# Patient Record
Sex: Female | Born: 1950 | Race: Black or African American | Hispanic: No | Marital: Married | Smoking: Never smoker
Health system: Southern US, Community
[De-identification: ages and names within clinical notes are randomized; demographics above are authoritative.]

## PROBLEM LIST (undated history)

## (undated) DIAGNOSIS — E7800 Pure hypercholesterolemia, unspecified: Secondary | ICD-10-CM

## (undated) DIAGNOSIS — I10 Essential (primary) hypertension: Secondary | ICD-10-CM

---

## 1998-08-04 ENCOUNTER — Encounter: Payer: Self-pay | Admitting: Emergency Medicine

## 1998-08-04 ENCOUNTER — Emergency Department (HOSPITAL_COMMUNITY): Admission: EM | Admit: 1998-08-04 | Discharge: 1998-08-04 | Payer: Self-pay | Admitting: Emergency Medicine

## 1999-02-10 ENCOUNTER — Encounter: Admission: RE | Admit: 1999-02-10 | Discharge: 1999-02-10 | Payer: Self-pay | Admitting: Family Medicine

## 1999-02-10 ENCOUNTER — Encounter: Payer: Self-pay | Admitting: Family Medicine

## 2000-11-30 ENCOUNTER — Emergency Department (HOSPITAL_COMMUNITY): Admission: EM | Admit: 2000-11-30 | Discharge: 2000-11-30 | Payer: Self-pay | Admitting: Emergency Medicine

## 2000-11-30 ENCOUNTER — Encounter: Payer: Self-pay | Admitting: Emergency Medicine

## 2001-04-18 ENCOUNTER — Encounter: Admission: RE | Admit: 2001-04-18 | Discharge: 2001-04-18 | Payer: Self-pay | Admitting: Family Medicine

## 2001-04-18 ENCOUNTER — Encounter: Payer: Self-pay | Admitting: Family Medicine

## 2008-10-31 ENCOUNTER — Emergency Department (HOSPITAL_COMMUNITY): Admission: EM | Admit: 2008-10-31 | Discharge: 2008-10-31 | Payer: Self-pay | Admitting: Emergency Medicine

## 2009-10-13 ENCOUNTER — Emergency Department (HOSPITAL_COMMUNITY): Admission: EM | Admit: 2009-10-13 | Discharge: 2009-10-13 | Payer: Self-pay | Admitting: Emergency Medicine

## 2010-04-03 LAB — BASIC METABOLIC PANEL
BUN: 8 mg/dL (ref 6–23)
CO2: 31 mEq/L (ref 19–32)
Calcium: 9.8 mg/dL (ref 8.4–10.5)
Chloride: 103 mEq/L (ref 96–112)
Creatinine, Ser: 0.87 mg/dL (ref 0.4–1.2)
GFR calc Af Amer: >60 mL/min (ref >60)
GFR calc non Af Amer: >60 mL/min (ref >60)
Glucose, Bld: 97 mg/dL (ref 70–99)
Potassium: 3.5 mEq/L (ref 3.5–5.1)
Sodium: 140 mEq/L (ref 135–145)

## 2010-04-03 LAB — DIFFERENTIAL
Basophils Absolute: 0 10*3/uL (ref 0.0–0.1)
Basophils Relative: 1 % (ref 0–1)
Eosinophils Absolute: 0.3 10*3/uL (ref 0.0–0.7)
Eosinophils Relative: 3 % (ref 0–5)
Lymphocytes Relative: 49 % — ABNORMAL HIGH (ref 12–46)
Lymphs Abs: 3.9 10*3/uL (ref 0.7–4.0)
Monocytes Absolute: 0.4 10*3/uL (ref 0.1–1.0)
Monocytes Relative: 5 % (ref 3–12)
Neutro Abs: 3.4 10*3/uL (ref 1.7–7.7)
Neutrophils Relative %: 42 % — ABNORMAL LOW (ref 43–77)

## 2010-04-03 LAB — URINALYSIS, ROUTINE W REFLEX MICROSCOPIC
Glucose, UA: NEGATIVE mg/dL
Hgb urine dipstick: NEGATIVE
Ketones, ur: NEGATIVE mg/dL
Protein, ur: NEGATIVE mg/dL

## 2010-04-03 LAB — CBC
HCT: 37.7 % (ref 36.0–46.0)
Hemoglobin: 12.5 g/dL (ref 12.0–15.0)
MCH: 27.7 pg (ref 26.0–34.0)
MCHC: 33.2 g/dL (ref 30.0–36.0)
MCV: 83.6 fL (ref 78.0–100.0)
Platelets: 272 10*3/uL (ref 150–400)
RBC: 4.51 MIL/uL (ref 3.87–5.11)
RDW: 14.5 % (ref 11.5–15.5)
WBC: 8 10*3/uL (ref 4.0–10.5)

## 2010-04-03 LAB — GLUCOSE, CAPILLARY: Glucose-Capillary: 96 mg/dL (ref 70–99)

## 2014-01-19 ENCOUNTER — Emergency Department (HOSPITAL_COMMUNITY)
Admission: EM | Admit: 2014-01-19 | Discharge: 2014-01-19 | Disposition: A | Discharge status: Home / Self Care | Payer: BC Managed Care – PPO | Attending: Emergency Medicine | Admitting: Emergency Medicine

## 2014-01-19 ENCOUNTER — Encounter (HOSPITAL_COMMUNITY): Payer: Self-pay | Admitting: Emergency Medicine

## 2014-01-19 DIAGNOSIS — N814 Uterovaginal prolapse, unspecified: Secondary | ICD-10-CM

## 2014-01-19 DIAGNOSIS — A5901 Trichomonal vulvovaginitis: Secondary | ICD-10-CM | POA: Diagnosis not present

## 2014-01-19 DIAGNOSIS — I10 Essential (primary) hypertension: Secondary | ICD-10-CM | POA: Diagnosis not present

## 2014-01-19 DIAGNOSIS — N898 Other specified noninflammatory disorders of vagina: Secondary | ICD-10-CM | POA: Diagnosis present

## 2014-01-19 DIAGNOSIS — Z79899 Other long term (current) drug therapy: Secondary | ICD-10-CM | POA: Insufficient documentation

## 2014-01-19 DIAGNOSIS — E782 Mixed hyperlipidemia: Secondary | ICD-10-CM | POA: Diagnosis not present

## 2014-01-19 HISTORY — DX: Pure hypercholesterolemia, unspecified: E78.00

## 2014-01-19 HISTORY — DX: Essential (primary) hypertension: I10

## 2014-01-19 LAB — URINALYSIS, ROUTINE W REFLEX MICROSCOPIC
BILIRUBIN URINE: NEGATIVE
Bilirubin Urine: NEGATIVE
GLUCOSE, UA: NEGATIVE mg/dL
Glucose, UA: NEGATIVE mg/dL
KETONES UR: NEGATIVE mg/dL
KETONES UR: NEGATIVE mg/dL
NITRITE: NEGATIVE
Nitrite: NEGATIVE
PROTEIN: NEGATIVE mg/dL
PROTEIN: NEGATIVE mg/dL
SPECIFIC GRAVITY, URINE: 1.01 (ref 1.005–1.030)
Specific Gravity, Urine: 1.004 — ABNORMAL LOW (ref 1.005–1.030)
UROBILINOGEN UA: 0.2 mg/dL (ref 0.0–1.0)
UROBILINOGEN UA: 0.2 mg/dL (ref 0.0–1.0)
pH: 7.5 (ref 5.0–8.0)
pH: 7.5 (ref 5.0–8.0)

## 2014-01-19 LAB — CBC WITH DIFFERENTIAL/PLATELET
BASOS ABS: 0.1 10*3/uL (ref 0.0–0.1)
Basophils Relative: 1 % (ref 0–1)
EOS ABS: 0.3 10*3/uL (ref 0.0–0.7)
EOS PCT: 3 % (ref 0–5)
HCT: 39.3 % (ref 36.0–46.0)
Hemoglobin: 13.1 g/dL (ref 12.0–15.0)
LYMPHS ABS: 4.4 10*3/uL — AB (ref 0.7–4.0)
Lymphocytes Relative: 39 % (ref 12–46)
MCH: 27.9 pg (ref 26.0–34.0)
MCHC: 33.3 g/dL (ref 30.0–36.0)
MCV: 83.6 fL (ref 78.0–100.0)
MONO ABS: 0.6 10*3/uL (ref 0.1–1.0)
Monocytes Relative: 5 % (ref 3–12)
NEUTROS PCT: 52 % (ref 43–77)
Neutro Abs: 6 10*3/uL (ref 1.7–7.7)
PLATELETS: 307 10*3/uL (ref 150–400)
RBC: 4.7 MIL/uL (ref 3.87–5.11)
RDW: 14.2 % (ref 11.5–15.5)
WBC: 11.4 10*3/uL — ABNORMAL HIGH (ref 4.0–10.5)

## 2014-01-19 LAB — URINE MICROSCOPIC-ADD ON

## 2014-01-19 LAB — WET PREP, GENITAL: Yeast Wet Prep HPF POC: NONE SEEN

## 2014-01-19 LAB — BASIC METABOLIC PANEL
ANION GAP: 11 (ref 5–15)
BUN: 10 mg/dL (ref 6–23)
CALCIUM: 9.9 mg/dL (ref 8.4–10.5)
CHLORIDE: 102 meq/L (ref 96–112)
CO2: 25 mmol/L (ref 19–32)
CREATININE: 0.83 mg/dL (ref 0.50–1.10)
GFR calc non Af Amer: 75 mL/min — ABNORMAL LOW (ref >90)
GFR, EST AFRICAN AMERICAN: 86 mL/min — AB (ref >90)
Glucose, Bld: 94 mg/dL (ref 70–99)
Potassium: 2.9 mmol/L — ABNORMAL LOW (ref 3.5–5.1)
Sodium: 138 mmol/L (ref 135–145)

## 2014-01-19 MED ORDER — CEPHALEXIN 500 MG PO CAPS: 500.0000 mg | ORAL_CAPSULE | Freq: Four times a day (QID) | ORAL | Status: AC

## 2014-01-19 MED ORDER — METRONIDAZOLE 500 MG PO TABS: 500.0000 mg | ORAL_TABLET | Freq: Two times a day (BID) | ORAL | Status: AC

## 2014-01-19 NOTE — ED Notes (Signed)
MD at bedside. 

## 2014-01-19 NOTE — ED Notes (Signed)
Pt A&OX4, ambulatory at d/c with steady gait, NAD 

## 2014-01-19 NOTE — ED Notes (Signed)
Pt c/o vaginal burning and itching x 1 week and sts some frequency with urination

## 2014-01-19 NOTE — ED Provider Notes (Signed)
CSN: [id number]     Arrival date & time 01/19/14  1817 History   First MD Initiated Contact with Patient 01/19/14 1916     Chief Complaint  Patient presents with  . Urinary Tract Infection  . Vaginal Discharge     (Consider location/radiation/quality/duration/timing/severity/associated sxs/prior Treatment) Patient is a 64 y.o. female presenting with vaginal discharge. The history is provided by the patient.  Vaginal Discharge Quality:  Yellow Severity:  Moderate Onset quality:  Gradual Duration:  1 week Timing:  Constant Progression:  Unchanged Chronicity:  New Relieved by:  Nothing Worsened by:  Nothing tried Ineffective treatments:  None tried Associated symptoms: dysuria   Associated symptoms: no abdominal pain, no fever, no nausea and no vomiting     Past Medical History  Diagnosis Date  . Hypertension   . Hypercholesteremia    History reviewed. No pertinent past surgical history. History reviewed. No pertinent family history. History  Substance Use Topics  . Smoking status: Never Smoker   . Smokeless tobacco: Not on file  . Alcohol Use: No   OB History    No data available     Review of Systems  Constitutional: Negative for fever, chills, diaphoresis, activity change, appetite change and fatigue.  HENT: Negative for facial swelling, rhinorrhea, sore throat, trouble swallowing and voice change.   Eyes: Negative for photophobia, pain and visual disturbance.  Respiratory: Negative for cough, shortness of breath, wheezing and stridor.   Cardiovascular: Negative for chest pain, palpitations and leg swelling.  Gastrointestinal: Negative for nausea, vomiting, abdominal pain, constipation and anal bleeding.  Endocrine: Negative.   Genitourinary: Positive for dysuria and vaginal discharge. Negative for vaginal bleeding and vaginal pain.  Musculoskeletal: Negative for myalgias, back pain and arthralgias.  Skin: Negative.  Negative for rash.  Allergic/Immunologic:  Negative.   Neurological: Negative for dizziness, tremors, syncope, weakness and headaches.  Psychiatric/Behavioral: Negative for suicidal ideas, sleep disturbance and self-injury.  All other systems reviewed and are negative.     Allergies  Review of patient's allergies indicates no known allergies.  Home Medications   Prior to Admission medications   Medication Sig Start Date End Date Taking? Authorizing Provider  hydrochlorothiazide (HYDRODIURIL) 25 MG tablet Take 25 mg by mouth daily.   Yes Historical Provider, MD  simvastatin (ZOCOR) 20 MG tablet Take 20 mg by mouth daily.   Yes Historical Provider, MD  cephALEXin (KEFLEX) 500 MG capsule Take 1 capsule (500 mg total) by mouth 4 (four) times daily. 01/19/14   Lula Olszewski, MD  metroNIDAZOLE (FLAGYL) 500 MG tablet Take 1 tablet (500 mg total) by mouth 2 (two) times daily. 01/19/14   Lula Olszewski, MD   BP 139/83 mmHg  Pulse 65  Temp(Src) 98.2 F (36.8 C) (Oral)  Resp 12  SpO2 98% Physical Exam  Constitutional: She is oriented to person, place, and time. She appears well-developed and well-nourished. No distress.  HENT:  Head: Normocephalic and atraumatic.  Right Ear: External ear normal.  Left Ear: External ear normal.  Mouth/Throat: Oropharynx is clear and moist. No oropharyngeal exudate.  Eyes: Conjunctivae and EOM are normal. Pupils are equal, round, and reactive to light. No scleral icterus.  Neck: Normal range of motion. Neck supple. No JVD present. No tracheal deviation present. No thyromegaly present.  Cardiovascular: Normal rate, regular rhythm and intact distal pulses.  Exam reveals no gallop and no friction rub.   No murmur heard. Pulmonary/Chest: Effort normal and breath sounds normal. No respiratory distress. She has no wheezes. She  has no rales.  Abdominal: Soft. Bowel sounds are normal. She exhibits no distension. There is no tenderness.  Genitourinary: Pelvic exam was performed with patient supine. Cervix exhibits no  motion tenderness. Right adnexum displays no mass, no tenderness and no fullness. Left adnexum displays no mass, no tenderness and no fullness. Vaginal discharge (yellow) found.  Tissue protruding into vagina on pelvic exam consistent with uterine prolapse. Incomplete and unincarcerated.   Musculoskeletal: Normal range of motion. She exhibits no edema or tenderness.  Neurological: She is alert and oriented to person, place, and time. No cranial nerve deficit. She exhibits normal muscle tone. Coordination normal.  Skin: Skin is warm and dry. She is not diaphoretic. No pallor.  Psychiatric: She has a normal mood and affect. She expresses no homicidal and no suicidal ideation. She expresses no suicidal plans and no homicidal plans.  Nursing note and vitals reviewed.   ED Course  Procedures (including critical care time) Labs Review Labs Reviewed  WET PREP, GENITAL - Abnormal; Notable for the following:    Trich, Wet Prep FEW (*)    Clue Cells Wet Prep HPF POC FEW (*)    WBC, Wet Prep HPF POC MODERATE (*)    All other components within normal limits  URINALYSIS, ROUTINE W REFLEX MICROSCOPIC - Abnormal; Notable for the following:    APPearance CLOUDY (*)    Hgb urine dipstick SMALL (*)    Leukocytes, UA LARGE (*)    All other components within normal limits  CBC WITH DIFFERENTIAL - Abnormal; Notable for the following:    WBC 11.4 (*)    Lymphs Abs 4.4 (*)    All other components within normal limits  BASIC METABOLIC PANEL - Abnormal; Notable for the following:    Potassium 2.9 (*)    GFR calc non Af Amer 75 (*)    GFR calc Af Amer 86 (*)    All other components within normal limits  URINE MICROSCOPIC-ADD ON - Abnormal; Notable for the following:    Squamous Epithelial / LPF MANY (*)    Bacteria, UA FEW (*)    All other components within normal limits  URINALYSIS, ROUTINE W REFLEX MICROSCOPIC - Abnormal; Notable for the following:    Color, Urine STRAW (*)    APPearance HAZY (*)     Specific Gravity, Urine 1.004 (*)    Hgb urine dipstick TRACE (*)    Leukocytes, UA LARGE (*)    All other components within normal limits  URINE MICROSCOPIC-ADD ON - Abnormal; Notable for the following:    Squamous Epithelial / LPF FEW (*)    Bacteria, UA FEW (*)    All other components within normal limits  GC/CHLAMYDIA PROBE AMP  RPR  HIV ANTIBODY (ROUTINE TESTING)    Imaging Review No results found.   EKG Interpretation None      MDM   Final diagnoses:  Trichomonas vaginitis  Prolapsed uterus    The patient is a 64 y.o. F who presents with 1 week of vaginal discharge and dyuria. Pelvic exam consistent with incomplete uterine prolapse, patient made aware of findings and need to follow up with OBGYN ASAP. Labs show trichomonas and equivial urine. Treated with flagyl and keflex. Patient discharged with above mentioned follow up and standard ED return precautions.  Patient seen with attending, Dr. Rubin Payor, who oversaw clinical decision making.     Lula Olszewski, MD 01/19/14 2220  Juliet Rude. Rubin Payor, MD 01/22/14 [phone]

## 2014-01-21 LAB — RPR

## 2014-01-21 LAB — HIV ANTIBODY (ROUTINE TESTING W REFLEX): HIV: NONREACTIVE

## 2014-01-24 LAB — GC/CHLAMYDIA PROBE AMP
CT Probe RNA: NEGATIVE
GC PROBE AMP APTIMA: NEGATIVE

## 2014-08-29 ENCOUNTER — Encounter (HOSPITAL_COMMUNITY): Payer: Self-pay | Admitting: Family Medicine

## 2014-08-29 ENCOUNTER — Emergency Department (HOSPITAL_COMMUNITY): Payer: BC Managed Care – PPO

## 2014-08-29 ENCOUNTER — Emergency Department (HOSPITAL_COMMUNITY)
Admission: EM | Admit: 2014-08-29 | Discharge: 2014-08-29 | Disposition: A | Discharge status: Home / Self Care | Payer: BC Managed Care – PPO | Attending: Emergency Medicine | Admitting: Emergency Medicine

## 2014-08-29 DIAGNOSIS — W010XXA Fall on same level from slipping, tripping and stumbling without subsequent striking against object, initial encounter: Secondary | ICD-10-CM | POA: Insufficient documentation

## 2014-08-29 DIAGNOSIS — Y9389 Activity, other specified: Secondary | ICD-10-CM | POA: Diagnosis not present

## 2014-08-29 DIAGNOSIS — I10 Essential (primary) hypertension: Secondary | ICD-10-CM | POA: Insufficient documentation

## 2014-08-29 DIAGNOSIS — E780 Pure hypercholesterolemia: Secondary | ICD-10-CM | POA: Diagnosis not present

## 2014-08-29 DIAGNOSIS — Z79899 Other long term (current) drug therapy: Secondary | ICD-10-CM | POA: Diagnosis not present

## 2014-08-29 DIAGNOSIS — S50312A Abrasion of left elbow, initial encounter: Secondary | ICD-10-CM | POA: Diagnosis not present

## 2014-08-29 DIAGNOSIS — Y9289 Other specified places as the place of occurrence of the external cause: Secondary | ICD-10-CM | POA: Diagnosis not present

## 2014-08-29 DIAGNOSIS — Y998 Other external cause status: Secondary | ICD-10-CM | POA: Diagnosis not present

## 2014-08-29 DIAGNOSIS — M25512 Pain in left shoulder: Secondary | ICD-10-CM

## 2014-08-29 DIAGNOSIS — W19XXXA Unspecified fall, initial encounter: Secondary | ICD-10-CM

## 2014-08-29 DIAGNOSIS — S4992XA Unspecified injury of left shoulder and upper arm, initial encounter: Secondary | ICD-10-CM | POA: Insufficient documentation

## 2014-08-29 MED ORDER — HYDROCODONE-ACETAMINOPHEN 5-325 MG PO TABS: 1.0000 | ORAL_TABLET | Freq: Four times a day (QID) | ORAL | Status: DC | PRN

## 2014-08-29 MED ORDER — IBUPROFEN 400 MG PO TABS
600.0000 mg | ORAL_TABLET | Freq: Once | ORAL | Status: AC
Start: 2014-08-29 — End: 2014-08-29
  Administered 2014-08-29: 600 mg via ORAL
  Filled 2014-08-29: qty 2

## 2014-08-29 MED ORDER — NAPROXEN 375 MG PO TABS: 375.0000 mg | ORAL_TABLET | Freq: Two times a day (BID) | ORAL | Status: AC

## 2014-08-29 NOTE — ED Provider Notes (Signed)
CSN: [id number]     Arrival date & time 08/29/14  1232 History  This chart was scribed for non-physician practitioner, Kerrie Buffalo, NP, working with Benjiman Core, MD, by Ronney Lion, ED Scribe. This patient was seen in room TR06C/TR06C and the patient's care was started at 1:24 PM.    Chief Complaint  Patient presents with  . Fall   Patient is a 64 y.o. female presenting with shoulder pain. The history is provided by the patient. No language interpreter was used.  Shoulder Pain Location:  Shoulder Time since incident:  3 hours Injury: yes   Mechanism of injury: fall   Fall:    Fall occurred:  Tripped   Height of fall:  Standing    Impact surface:  Primary school teacher of impact:  Outstretched arms Pain details:    Quality:  Aching   Radiates to:  Does not radiate   Severity:  Moderate   Onset quality:  Sudden   Duration:  3 hours   Timing:  Constant   Progression:  Worsening Chronicity:  New Dislocation: no   Foreign body present:  No foreign bodies Tetanus status:  Up to date Relieved by:  None tried Worsened by:  Stretching area Ineffective treatments:  None tried Associated symptoms: no numbness    HPI Comments: Kristine Bailey is a 64 y.o. female who presents to the Emergency Department complaining of left shoulder pain S/P a fall that occurred 3 hours ago. Patient states she was in a meeting when she tripped over a chair and landed on a concrete florr with her left arm outstretched. She also scraped her left elbow on the floor. She denies head injury, LOC, or any other injuries. Raising her arms exacerbates her pain. Patient states she is UTD on her tetanus vaccinations.   Patient works at [employer] at [employer].   Past Medical History  Diagnosis Date  . Hypertension   . Hypercholesteremia    History reviewed. No pertinent past surgical history. History reviewed. No pertinent family history. Social History  Substance Use Topics  . Smoking status: Never Smoker   . Smokeless  tobacco: None  . Alcohol Use: No   OB History    No data available     Review of Systems  Respiratory: Negative for shortness of breath.   Cardiovascular: Negative for chest pain.  Gastrointestinal: Negative for abdominal pain.  Musculoskeletal: Positive for arthralgias.       Left shoulder pain  Skin: Wound: abrasion to left arm.  Neurological: Negative for numbness and headaches.  All other systems reviewed and are negative.   Allergies  Review of patient's allergies indicates no known allergies.  Home Medications   Prior to Admission medications   Medication Sig Start Date End Date Taking? Authorizing Provider  cephALEXin (KEFLEX) 500 MG capsule Take 1 capsule (500 mg total) by mouth 4 (four) times daily. 01/19/14   Lula Olszewski, MD  hydrochlorothiazide (HYDRODIURIL) 25 MG tablet Take 25 mg by mouth daily.    Historical Provider, MD  HYDROcodone-acetaminophen (NORCO) 5-325 MG per tablet Take 1 tablet by mouth every 6 (six) hours as needed for moderate pain. 08/29/14   Hope Orlene Och, NP  metroNIDAZOLE (FLAGYL) 500 MG tablet Take 1 tablet (500 mg total) by mouth 2 (two) times daily. 01/19/14   Lula Olszewski, MD  naproxen (NAPROSYN) 375 MG tablet Take 1 tablet (375 mg total) by mouth 2 (two) times daily. 08/29/14   Hope Orlene Och, NP  simvastatin (ZOCOR) 20  MG tablet Take 20 mg by mouth daily.    Historical Provider, MD   BP 127/84 mmHg  Pulse 73  Temp(Src) 97.7 F (36.5 C) (Oral)  Resp 16  SpO2 99% Physical Exam  Constitutional: She is oriented to person, place, and time. She appears well-developed and well-nourished. No distress.  HENT:  Head: Normocephalic and atraumatic.  Eyes: Conjunctivae and EOM are normal.  Neck: Normal range of motion. Neck supple. No tracheal deviation present.  Cardiovascular: Normal rate.   Pulses:      Radial pulses are 2+ on the right side, and 2+ on the left side.  Pulmonary/Chest: Effort normal. No respiratory distress.  Musculoskeletal: Normal  range of motion.       Left shoulder: She exhibits tenderness. She exhibits normal range of motion, no crepitus, no deformity, no laceration, normal pulse and normal strength.  Radial pulses are 2+. Grip strength is equal bilaterally.   Left shoulder: Full ROM. Tenderness anterior and posterior aspect of left shoulder with ROM.  Left elbow: Full ROM. Small abrasion. Elbow non-tender.  Neurological: She is alert and oriented to person, place, and time.  Skin: Skin is warm and dry.  Psychiatric: She has a normal mood and affect. Her behavior is normal.  Nursing note and vitals reviewed.   ED Course  Procedures (including critical care time)  DIAGNOSTIC STUDIES: Oxygen Saturation is 99% on, R/A COORDINATION OF CARE: 1:29 PM - Currently awaiting XR results. Discussed with pt that XR's will only show bone injuries. not ligamentous injuries. Discussed treatment plan with pt at bedside which includes, if XR is negative for fracture, placement in a sling, ice, and Rx NSAIDs. Advised pt to f/u with an orthopedist for further evaluation if her pain does not improve with this treatment. Pt verbalized understanding and agreed to plan.   Imaging Review Dg Shoulder Left  08/29/2014   CLINICAL DATA:  Fall.  Shoulder pain  EXAM: LEFT SHOULDER - 2+ VIEW  COMPARISON:  None.  FINDINGS: There is no evidence of fracture or dislocation. There is no evidence of arthropathy or other focal bone abnormality. Soft tissues are unremarkable.  IMPRESSION: Negative.   Electronically Signed   By: Marlan Palau M.D.   On: 08/29/2014 13:37    1:47 PM - XR results and treatment plan again reviewed with pt. Will discharge home with written instructions.    MDM  64 y.o. female with pain to the left shoulder s/p fall. Stable for d/c without neurovascular compromise. Discussed with the patient clinical and x-ray findings and plan of care and all questioned fully answered. She will follow up with her orthopedic doctor or  return here as needed. Sling applied,ice, pain management.    Final diagnoses:  Left shoulder pain  Fall, initial encounter   I personally performed the services described in this documentation, which was scribed in my presence. The recorded information has been reviewed and is accurate.      [address] [place], NP 08/29/14 1405  Benjiman Core, MD 09/03/14 [phone]

## 2014-08-29 NOTE — ED Notes (Signed)
Pt here for trip and fall and injury to left shoulder.

## 2014-08-29 NOTE — Discharge Instructions (Signed)
Follow up with your doctor at [facility] if you pain continues.

## 2015-11-01 ENCOUNTER — Encounter (HOSPITAL_COMMUNITY): Payer: Self-pay

## 2015-11-01 ENCOUNTER — Emergency Department (HOSPITAL_COMMUNITY)
Admission: EM | Admit: 2015-11-01 | Discharge: 2015-11-01 | Disposition: A | Discharge status: Home / Self Care | Payer: BC Managed Care – PPO | Attending: Emergency Medicine | Admitting: Emergency Medicine

## 2015-11-01 DIAGNOSIS — Y92410 Unspecified street and highway as the place of occurrence of the external cause: Secondary | ICD-10-CM | POA: Diagnosis not present

## 2015-11-01 DIAGNOSIS — S199XXA Unspecified injury of neck, initial encounter: Secondary | ICD-10-CM | POA: Diagnosis present

## 2015-11-01 DIAGNOSIS — I10 Essential (primary) hypertension: Secondary | ICD-10-CM | POA: Diagnosis not present

## 2015-11-01 DIAGNOSIS — Y999 Unspecified external cause status: Secondary | ICD-10-CM | POA: Insufficient documentation

## 2015-11-01 DIAGNOSIS — Y939 Activity, unspecified: Secondary | ICD-10-CM | POA: Insufficient documentation

## 2015-11-01 DIAGNOSIS — S161XXA Strain of muscle, fascia and tendon at neck level, initial encounter: Secondary | ICD-10-CM | POA: Diagnosis not present

## 2015-11-01 MED ORDER — HYDROCODONE-ACETAMINOPHEN 5-325 MG PO TABS: 1.0000 | ORAL_TABLET | ORAL | 0 refills | Status: AC | PRN

## 2015-11-01 MED ORDER — METHOCARBAMOL 500 MG PO TABS: 500.0000 mg | ORAL_TABLET | Freq: Every evening | ORAL | 0 refills | Status: AC | PRN

## 2015-11-01 MED ORDER — IBUPROFEN 400 MG PO TABS
600.0000 mg | ORAL_TABLET | Freq: Once | ORAL | Status: AC
  Administered 2015-11-01: 600 mg via ORAL
  Filled 2015-11-01: qty 1
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
## 2015-11-01 NOTE — Discharge Instructions (Signed)
Take Ibuprofen 400mg  three times daily for the next 5 days or Naproxen (Aleve) 1000mg  twice daily for the next 5 days Take the muscle relaxer at bedtime to help you sleep Take the narcotic pain medicine when pain is severe Do not take the muscle relaxer or pain medicine before driving
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
## 2015-11-01 NOTE — Progress Notes (Signed)
Orthopedic Tech Progress Note Patient Details:  Kristine ShirtsBlanche Bailey 03/18/1950 [id number]  Ortho Devices Type of Ortho Device: Soft collar Ortho Device/Splint Interventions: Application   Saul FordyceJennifer C [name] 11/01/2015, 9:43 AM

## 2015-11-01 NOTE — ED Notes (Signed)
Ortho tech notified.  

## 2015-11-01 NOTE — ED Notes (Signed)
Called ortho tech x 2 for soft cervical collar.

## 2015-12-13 ENCOUNTER — Encounter (HOSPITAL_COMMUNITY): Payer: Self-pay

## 2015-12-13 ENCOUNTER — Ambulatory Visit (HOSPITAL_COMMUNITY)
Admission: EM | Admit: 2015-12-13 | Discharge: 2015-12-13 | Disposition: A | Discharge status: Home / Self Care | Payer: BC Managed Care – PPO | Attending: Emergency Medicine | Admitting: Emergency Medicine

## 2015-12-13 DIAGNOSIS — J029 Acute pharyngitis, unspecified: Secondary | ICD-10-CM | POA: Insufficient documentation

## 2015-12-13 LAB — POCT RAPID STREP A: STREPTOCOCCUS, GROUP A SCREEN (DIRECT): NEGATIVE

## 2015-12-13 LAB — POCT INFECTIOUS MONO SCREEN: Mono Screen: NEGATIVE

## 2015-12-13 MED ORDER — AMOXICILLIN 500 MG PO CAPS: 1000.0000 mg | ORAL_CAPSULE | Freq: Two times a day (BID) | ORAL | 0 refills | Status: AC

## 2015-12-13 NOTE — ED Provider Notes (Signed)
CSN: [id number]     Arrival date & time 12/13/15  1013 History   None    Chief Complaint  Patient presents with  . Sore Throat   (Consider location/radiation/quality/duration/timing/severity/associated sxs/prior Treatment) 65 year old female complaining of a sore throat for 3 days. She states associated with a fever and is only able to provide today's temperature 102.3 degrees. Denies earache or GI symptoms. Denies cough, sneeze or PND.      Past Medical History:  Diagnosis Date  . Hypercholesteremia   . Hypertension    History reviewed. No pertinent surgical history. No family history on file. Social History  Substance Use Topics  . Smoking status: Never Smoker  . Smokeless tobacco: Never Used  . Alcohol use No   OB History    No data available     Review of Systems  Constitutional: Positive for activity change and fever. Negative for appetite change and fatigue.  HENT: Positive for sore throat. Negative for ear pain, postnasal drip, rhinorrhea and sneezing.   Eyes: Negative.   Respiratory: Negative.   Cardiovascular: Negative.   Gastrointestinal: Negative.   Neurological: Negative.   All other systems reviewed and are negative.   Allergies  Patient has no known allergies.  Home Medications   Prior to Admission medications   Medication Sig Start Date End Date Taking? Authorizing Provider  hydrochlorothiazide (HYDRODIURIL) 25 MG tablet Take 25 mg by mouth daily.   Yes Historical Provider, MD  amoxicillin (AMOXIL) 500 MG capsule Take 2 capsules (1,000 mg total) by mouth 2 (two) times daily. 12/13/15   Hayden Rasmussenavid Kambrey Hagger, NP  cephALEXin (KEFLEX) 500 MG capsule Take 1 capsule (500 mg total) by mouth 4 (four) times daily. 01/19/14   Lula OlszewskiMike Goebel, MD  HYDROcodone-acetaminophen (NORCO/VICODIN) 5-325 MG tablet Take 1-2 tablets by mouth every 4 (four) hours as needed. 11/01/15   Bethel BornKelly Marie Gekas, PA-C  methocarbamol (ROBAXIN) 500 MG tablet Take 1 tablet (500 mg total) by mouth at  bedtime and may repeat dose one time if needed. 11/01/15   Bethel BornKelly Marie Gekas, PA-C  metroNIDAZOLE (FLAGYL) 500 MG tablet Take 1 tablet (500 mg total) by mouth 2 (two) times daily. 01/19/14   Lula OlszewskiMike Goebel, MD  naproxen (NAPROSYN) 375 MG tablet Take 1 tablet (375 mg total) by mouth 2 (two) times daily. 08/29/14   Hope Orlene OchM Neese, NP  simvastatin (ZOCOR) 20 MG tablet Take 20 mg by mouth daily.    Historical Provider, MD   Meds Ordered and Administered this Visit  Medications - No data to display  BP 129/85 (BP Location: Right Arm)   Pulse 92   Temp 102.3 F (39.1 C) (Oral)   Resp 16   Ht 5\' 3"  (1.6 m)   Wt 184 lb (83.5 kg)   SpO2 96%   BMI 32.59 kg/m  No data found.   Physical Exam  Constitutional: She is oriented to person, place, and time. She appears well-developed and well-nourished. No distress.  HENT:  Head: Normocephalic and atraumatic.  Right Ear: External ear normal.  Oropharynx with smooth deep red mucous membranes. No exudate. No abscess formation.  Bilateral TMs are normal.  Eyes: EOM are normal.  Neck: Normal range of motion. Neck supple.  Cardiovascular: Normal rate and regular rhythm.   Pulmonary/Chest: Effort normal and breath sounds normal. No respiratory distress.  Musculoskeletal: Normal range of motion. She exhibits no edema.  Lymphadenopathy:    She has no cervical adenopathy.  Neurological: She is alert and oriented to person, place, and  time.  Skin: Skin is warm and dry. No rash noted.  Nursing note and vitals reviewed.   Urgent Care Course   Clinical Course     Procedures (including critical care time)  Labs Review Labs Reviewed  POCT RAPID STREP A  POCT INFECTIOUS MONO SCREEN   Results for orders placed or performed during the hospital encounter of 12/13/15  POCT rapid strep A [facility]  Result Value Ref Range   Streptococcus, Group A Screen (Direct) NEGATIVE NEGATIVE  Infectious mono screen, POC  Result Value Ref Range   Mono Screen  NEGATIVE NEGATIVE     Imaging Review No results found.   Visual Acuity Review  Right Eye Distance:   Left Eye Distance:   Bilateral Distance:    Right Eye Near:   Left Eye Near:    Bilateral Near:         MDM   1. Pharyngitis, unspecified etiology   2. Sore throat    The test for mono was negative. The test for strep is negative. The strep test has a 10-15% error rate. Since her temperature is so elevated associated with your sore throat Will treat with amoxicillin for possible strep. A culture of her throat will be obtained. If it is negative he will be called. It is certainly possible that a viral sore throat which is more common than strep throat is the type of sore throat you are having. Drink plenty of cool liquids and stay well-hydrated. Take ibuprofen for sore throat pain. Drink plenty of cool liquids. Cepacol lozenges which help numb the throat for sore throat pain. Follow-up with your doctor as needed. Meds ordered this encounter  Medications  . amoxicillin (AMOXIL) 500 MG capsule    Sig: Take 2 capsules (1,000 mg total) by mouth 2 (two) times daily.    Dispense:  32 capsule    Refill:  0    Order Specific Question:   Supervising Provider    Answer:   Charm RingsHONIG, ERIN J [[id number]]       Hayden Rasmussenavid Sarin [place], NP 12/13/15 1231

## 2015-12-13 NOTE — Discharge Instructions (Signed)
The test for mono was negative. The test for strep is negative. The strep test has a 10-15% error rate. Since her temperature is so elevated associated with your sore throat Will treat with amoxicillin for possible strep. A culture of her throat will be obtained. If it is negative he will be called. It is certainly possible that a viral sore throat which is more common than strep throat is the type of sore throat you are having. Drink plenty of cool liquids and stay well-hydrated. Take ibuprofen for sore throat pain. Drink plenty of cool liquids. Cepacol lozenges which help numb the throat for sore throat pain. Follow-up with your doctor as needed.

## 2015-12-13 NOTE — ED Triage Notes (Signed)
Sore throat since Tuesday, fever of 102.6 andfeeling nauseous. No coughing or sneezing. Has took tylenol and coricidin without relief.

## 2015-12-15 LAB — CULTURE, GROUP A STREP (THRC)

## 2015-12-29 IMAGING — CR DG SHOULDER 2+V*L*
3 series · 3 of 3 positions shown · non-contrast
Comparison: None.

CLINICAL DATA: Fall.  Shoulder pain

EXAM:
LEFT SHOULDER - 2+ VIEW

[shoulder grashey]
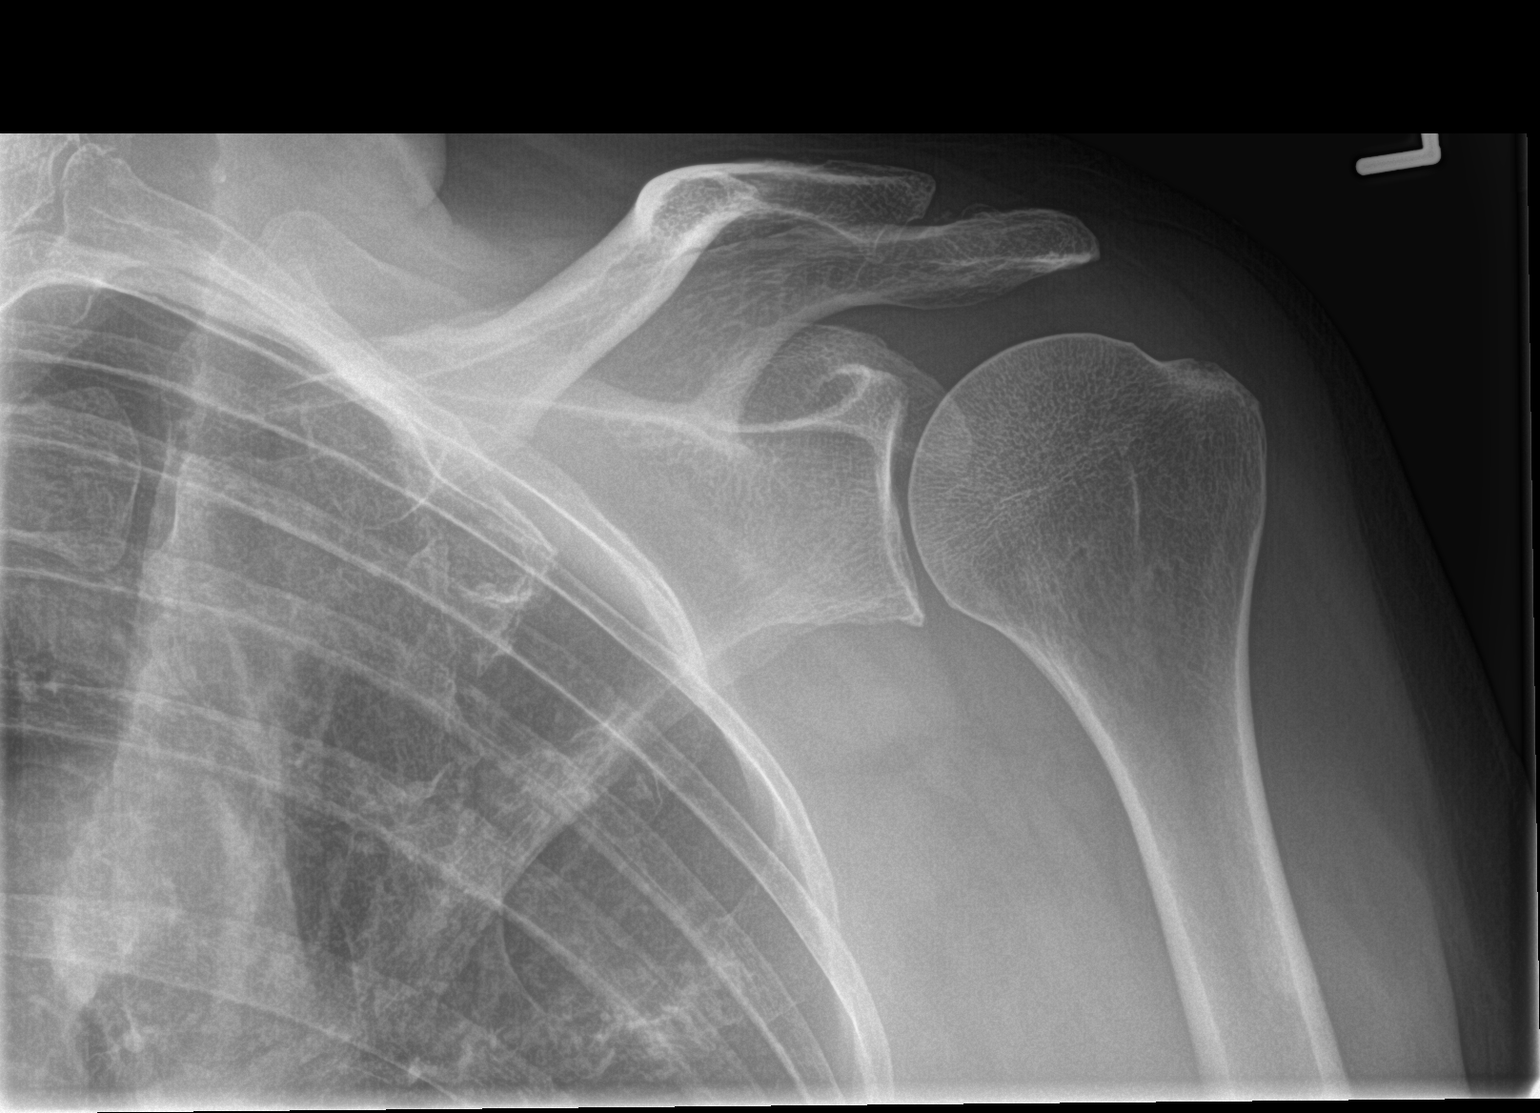

[shoulder y view]
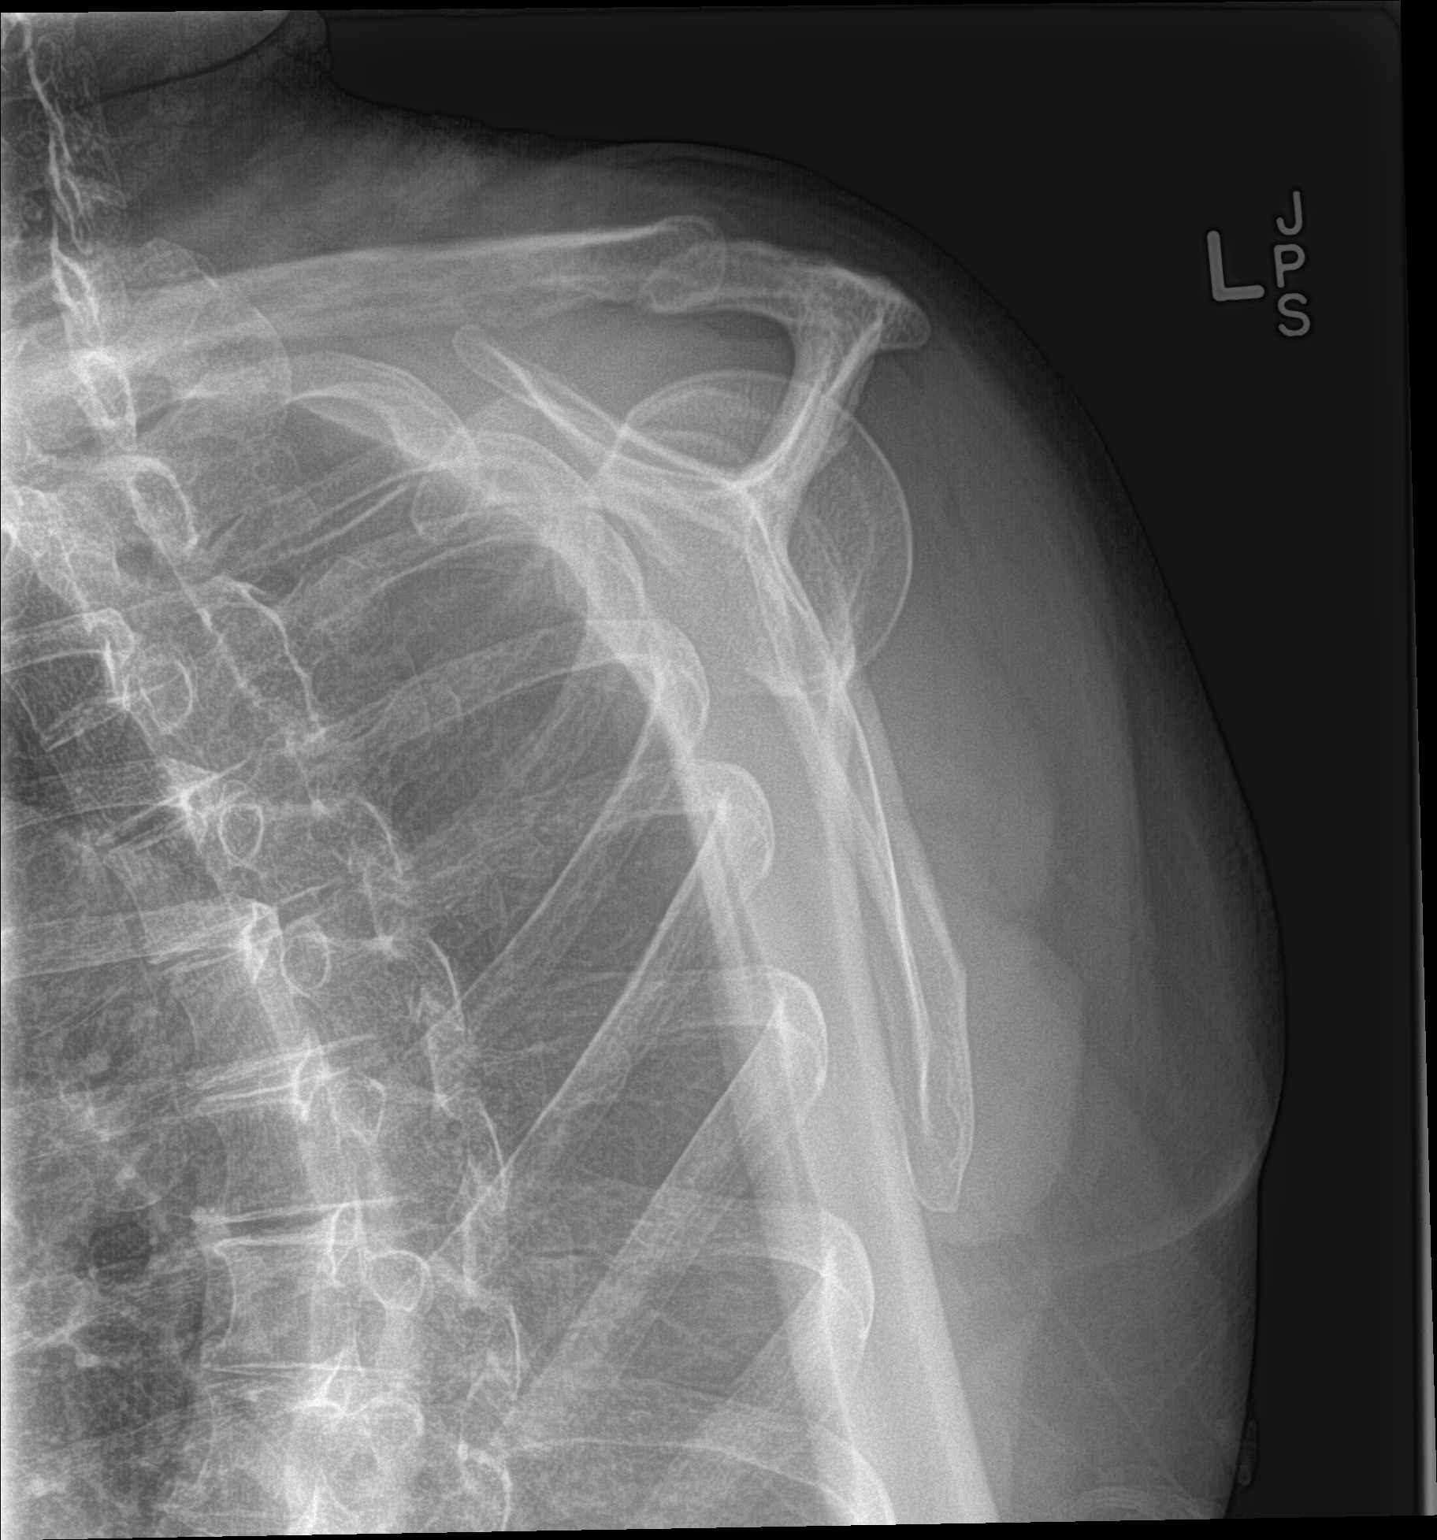

[shoulder axillary]
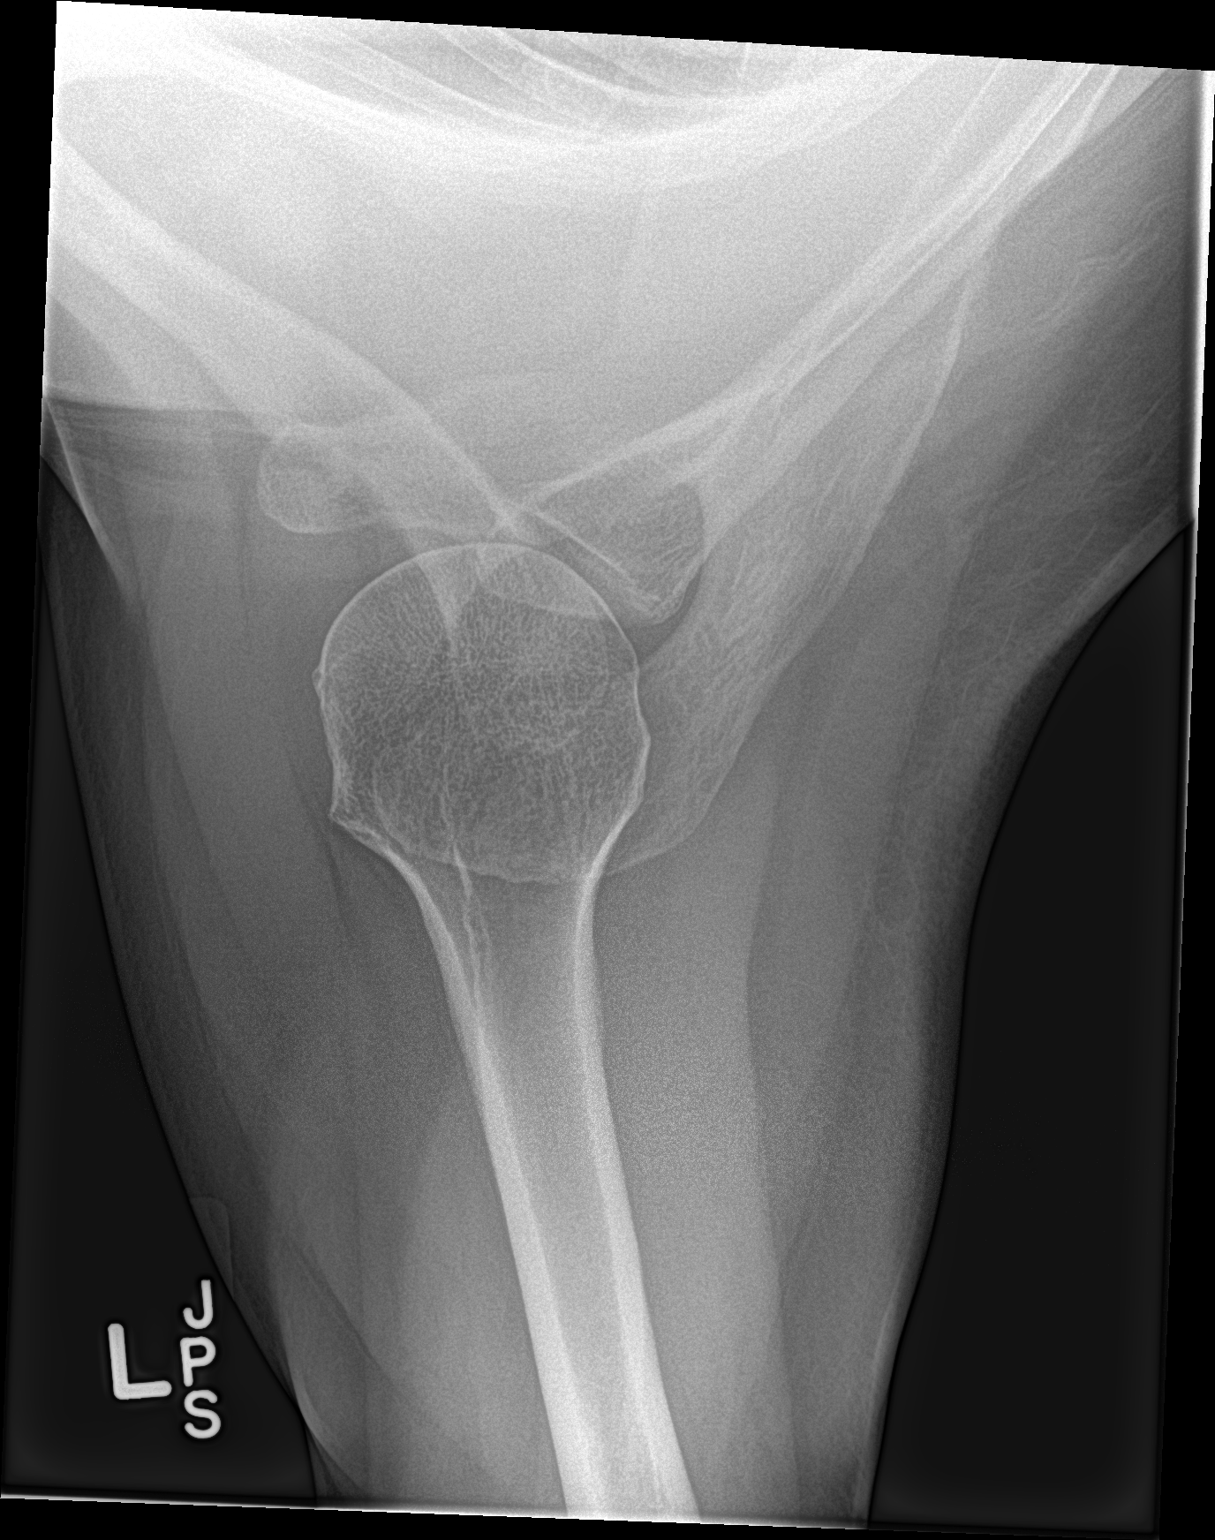

[3 of 3 positions shown; findings below may reference images not displayed]

FINDINGS: There is no evidence of fracture or dislocation. There is no
evidence of arthropathy or other focal bone abnormality. Soft
tissues are unremarkable.
IMPRESSION: Negative.

## 2020-09-12 ENCOUNTER — Encounter (HOSPITAL_COMMUNITY): Payer: Self-pay | Admitting: Emergency Medicine

## 2020-09-12 ENCOUNTER — Other Ambulatory Visit: Payer: Self-pay

## 2020-09-12 ENCOUNTER — Emergency Department (HOSPITAL_COMMUNITY)
Admission: EM | Admit: 2020-09-12 | Discharge: 2020-09-12 | Disposition: A | Discharge status: Home / Self Care | Payer: Medicare PPO | Attending: Emergency Medicine | Admitting: Emergency Medicine

## 2020-09-12 DIAGNOSIS — I10 Essential (primary) hypertension: Secondary | ICD-10-CM | POA: Diagnosis not present

## 2020-09-12 DIAGNOSIS — Z79899 Other long term (current) drug therapy: Secondary | ICD-10-CM | POA: Diagnosis not present

## 2020-09-12 DIAGNOSIS — R002 Palpitations: Secondary | ICD-10-CM | POA: Insufficient documentation

## 2020-09-12 LAB — CBC WITH DIFFERENTIAL/PLATELET
Abs Immature Granulocytes: 0.02 10*3/uL (ref 0.00–0.07)
Basophils Absolute: 0.1 10*3/uL (ref 0.0–0.1)
Basophils Relative: 1 %
Eosinophils Absolute: 0.3 10*3/uL (ref 0.0–0.5)
Eosinophils Relative: 3 %
HCT: 42.8 % (ref 36.0–46.0)
Hemoglobin: 13.5 g/dL (ref 12.0–15.0)
Immature Granulocytes: 0 %
Lymphocytes Relative: 45 %
Lymphs Abs: 4 10*3/uL (ref 0.7–4.0)
MCH: 26.3 pg (ref 26.0–34.0)
MCHC: 31.5 g/dL (ref 30.0–36.0)
MCV: 83.4 fL (ref 80.0–100.0)
Monocytes Absolute: 0.5 10*3/uL (ref 0.1–1.0)
Monocytes Relative: 6 %
Neutro Abs: 4 10*3/uL (ref 1.7–7.7)
Neutrophils Relative %: 45 %
Platelets: 317 10*3/uL (ref 150–400)
RBC: 5.13 MIL/uL — ABNORMAL HIGH (ref 3.87–5.11)
RDW: 15.6 % — ABNORMAL HIGH (ref 11.5–15.5)
WBC: 8.8 10*3/uL (ref 4.0–10.5)
nRBC: 0 % (ref 0.0–0.2)

## 2020-09-12 LAB — BASIC METABOLIC PANEL
Anion gap: 11 (ref 5–15)
BUN: 14 mg/dL (ref 8–23)
CO2: 24 mmol/L (ref 22–32)
Calcium: 9.8 mg/dL (ref 8.9–10.3)
Chloride: 104 mmol/L (ref 98–111)
Creatinine, Ser: 0.86 mg/dL (ref 0.44–1.00)
GFR, Estimated: >60 mL/min (ref >60)
Glucose, Bld: 98 mg/dL (ref 70–99)
Potassium: 3.9 mmol/L (ref 3.5–5.1)
Sodium: 139 mmol/L (ref 135–145)

## 2020-09-12 LAB — MAGNESIUM: Magnesium: 2.2 mg/dL (ref 1.7–2.4)

## 2020-09-12 NOTE — ED Provider Notes (Signed)
Emergency Medicine Provider Triage Evaluation Note  Kristine Bailey , a 70 y.o. female  was evaluated in triage.  Pt complains of palpitations starting this morning.  She reports a racing heart sensation with mild shortness of breath.  She has a history of a "innocent" murmur.  No symptoms of illness.  Denies history of thyroid issue.  Review of Systems  Positive: Palpitation Negative: Chest pain  Physical Exam  BP (!) 189/96 (BP Location: Right Arm)   Pulse 71   Temp 98.5 F (36.9 C) (Oral)   Ht 5\' 3"  (1.6 m)   Wt 84.8 kg   SpO2 98%   BMI 33.13 kg/m  Gen:   Awake, no distress   Resp:  Normal effort  MSK:   Moves extremities without difficulty  Other:  Patient with occasional to frequent ectopy, no murmurs heard  Medical Decision Making  Medically screening exam initiated at 6:23 PM.  Appropriate orders placed.  Kristine Bailey was informed that the remainder of the evaluation will be completed by another provider, this initial triage assessment does not replace that evaluation, and the importance of remaining in the ED until their evaluation is complete.     Manfred Shirts, PA-C 09/12/20 09/14/20, MD 09/17/20 [id number]

## 2020-09-12 NOTE — ED Triage Notes (Signed)
Pt arrived via POV with c/o palpitations starting this morning. Pt reports "feeling like her heart is racing" Has hx of heart murmur and has had multiple EKGs and all have been normal.

## 2020-09-12 NOTE — ED Provider Notes (Signed)
[facility]-EMERGENCY DEPT Provider Note   CSN: [id number] Arrival date & time: 09/12/20  1753     History Chief Complaint  Patient presents with   Palpitations    Kristine Bailey is a 70 y.o. female.  70 year old female who presents with intermittent palpitations x1 day.  No associated syncope or near syncope.  No headache.  No shortness of breath in her chest.  Chest pressure.  Denies any new medications.  Patient states it feels like her heart is skipping a beat.  No prior history of same.  No treatment use was prior to arrival      Past Medical History:  Diagnosis Date   Hypercholesteremia    Hypertension     There are no problems to display for this patient.   No past surgical history on file.   OB History   No obstetric history on file.     No family history on file.  Social History   Tobacco Use   Smoking status: Never   Smokeless tobacco: Never  Substance Use Topics   Alcohol use: No   Drug use: No    Home Medications Prior to Admission medications   Medication Sig Start Date End Date Taking? Authorizing Provider  amoxicillin (AMOXIL) 500 MG capsule Take 2 capsules (1,000 mg total) by mouth 2 (two) times daily. 12/13/15   Hayden Rasmussen, NP  cephALEXin (KEFLEX) 500 MG capsule Take 1 capsule (500 mg total) by mouth 4 (four) times daily. 01/19/14   Lula Olszewski, MD  hydrochlorothiazide (HYDRODIURIL) 25 MG tablet Take 25 mg by mouth daily.    [provider]  HYDROcodone-acetaminophen (NORCO/VICODIN) 5-325 MG tablet Take 1-2 tablets by mouth every 4 (four) hours as needed. 11/01/15   Bethel Born, PA-C  methocarbamol (ROBAXIN) 500 MG tablet Take 1 tablet (500 mg total) by mouth at bedtime and may repeat dose one time if needed. 11/01/15   Bethel Born, PA-C  metroNIDAZOLE (FLAGYL) 500 MG tablet Take 1 tablet (500 mg total) by mouth 2 (two) times daily. 01/19/14   Lula Olszewski, MD  naproxen (NAPROSYN) 375 MG tablet Take 1  tablet (375 mg total) by mouth 2 (two) times daily. 08/29/14   Janne Napoleon, NP  simvastatin (ZOCOR) 20 MG tablet Take 20 mg by mouth daily.    [provider]    Allergies    Patient has no known allergies.  Review of Systems   Review of Systems  All other systems reviewed and are negative.  Physical Exam Updated Vital Signs BP (!) 146/87   Pulse 64   Temp 98.5 F (36.9 C) (Oral)   Resp 17   Ht 1.6 m (5\' 3" )   Wt 84.8 kg   SpO2 99%   BMI 33.13 kg/m   Physical Exam Vitals and nursing note reviewed.  Constitutional:      General: She is not in acute distress.    Appearance: Normal appearance. She is well-developed. She is not toxic-appearing.  HENT:     Head: Normocephalic and atraumatic.  Eyes:     General: Lids are normal.     Conjunctiva/sclera: Conjunctivae normal.     Pupils: Pupils are equal, round, and reactive to light.  Neck:     Thyroid: No thyroid mass.     Trachea: No tracheal deviation.  Cardiovascular:     Rate and Rhythm: Normal rate and regular rhythm.     Heart sounds: Normal heart sounds. No murmur heard.  No gallop.  Pulmonary:     Effort: Pulmonary effort is normal. No respiratory distress.     Breath sounds: Normal breath sounds. No stridor. No decreased breath sounds, wheezing, rhonchi or rales.  Abdominal:     General: There is no distension.     Palpations: Abdomen is soft.     Tenderness: There is no abdominal tenderness. There is no rebound.  Musculoskeletal:        General: No tenderness. Normal range of motion.     Cervical back: Normal range of motion and neck supple.  Skin:    General: Skin is warm and dry.     Findings: No abrasion or rash.  Neurological:     Mental Status: She is alert and oriented to person, place, and time. Mental status is at baseline.     GCS: GCS eye subscore is 4. GCS verbal subscore is 5. GCS motor subscore is 6.     Cranial Nerves: Cranial nerves are intact. No cranial nerve deficit.      Sensory: No sensory deficit.     Motor: Motor function is intact.  Psychiatric:        Attention and Perception: Attention normal.        Speech: Speech normal.        Behavior: Behavior normal.    ED Results / Procedures / Treatments   Labs (all labs ordered are listed, but only abnormal results are displayed) Labs Reviewed  CBC WITH DIFFERENTIAL/PLATELET - Abnormal; Notable for the following components:      Result Value   RBC 5.13 (*)    RDW 15.6 (*)    All other components within normal limits  BASIC METABOLIC PANEL  MAGNESIUM    EKG EKG Interpretation  Date/Time:  Thursday September 12 2020 19:19:12 EDT Ventricular Rate:  66 PR Interval:  160 QRS Duration: 101 QT Interval:  446 QTC Calculation: 468 R Axis:   -13 Text Interpretation: Sinus rhythm Inferior infarct, old Confirmed by Lorre Nick ([id number]) on 09/12/2020 7:24:19 PM  Radiology No results found.  Procedures Procedures   Medications Ordered in ED Medications - No data to display  ED Course  I have reviewed the triage vital signs and the nursing notes.  Pertinent labs & imaging results that were available during my care of the patient were reviewed by me and considered in my medical decision making (see chart for details).    MDM Rules/Calculators/A&P                           Patient is EKG without acute changes.  Labs reassuring here.  Will discharge with outpatient follow-up Final Clinical Impression(s) / ED Diagnoses Final diagnoses:  None    Rx / DC Orders ED Discharge Orders     None        Lorre Nick, MD 09/12/20 1944
